# Patient Record
Sex: Male | Born: 1995 | Race: Black or African American | Hispanic: No | Marital: Single | Smoking: Never smoker
Health system: Southern US, Community
[De-identification: ages and names within clinical notes are randomized; demographics above are authoritative.]

---
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
## 2016-08-15 ENCOUNTER — Emergency Department (HOSPITAL_BASED_OUTPATIENT_CLINIC_OR_DEPARTMENT_OTHER)
Admission: EM | Admit: 2016-08-15 | Discharge: 2016-08-15 | Disposition: A | Discharge status: Home / Self Care | Payer: Self-pay | Attending: Emergency Medicine | Admitting: Emergency Medicine

## 2016-08-15 ENCOUNTER — Encounter (HOSPITAL_BASED_OUTPATIENT_CLINIC_OR_DEPARTMENT_OTHER): Payer: Self-pay | Admitting: *Deleted

## 2016-08-15 ENCOUNTER — Emergency Department (HOSPITAL_BASED_OUTPATIENT_CLINIC_OR_DEPARTMENT_OTHER): Payer: Self-pay

## 2016-08-15 DIAGNOSIS — R05 Cough: Secondary | ICD-10-CM | POA: Insufficient documentation

## 2016-08-15 DIAGNOSIS — M791 Myalgia: Secondary | ICD-10-CM | POA: Insufficient documentation

## 2016-08-15 DIAGNOSIS — J111 Influenza due to unidentified influenza virus with other respiratory manifestations: Secondary | ICD-10-CM

## 2016-08-15 DIAGNOSIS — R630 Anorexia: Secondary | ICD-10-CM | POA: Insufficient documentation

## 2016-08-15 DIAGNOSIS — R51 Headache: Secondary | ICD-10-CM | POA: Insufficient documentation

## 2016-08-15 DIAGNOSIS — R69 Illness, unspecified: Secondary | ICD-10-CM

## 2016-08-15 MED ORDER — ACETAMINOPHEN 500 MG PO TABS
1000.0000 mg | ORAL_TABLET | Freq: Once | ORAL | Status: AC
  Administered 2016-08-15: 1000 mg via ORAL
  Filled 2016-08-15: qty 2

## 2016-08-15 MED ORDER — IBUPROFEN 800 MG PO TABS
800.0000 mg | ORAL_TABLET | Freq: Once | ORAL | Status: AC
  Administered 2016-08-15: 800 mg via ORAL
  Filled 2016-08-15: qty 1

## 2016-08-15 NOTE — Discharge Instructions (Signed)
Return to the ED with any concerns including difficulty breathing, vomiting and not able to keep down liquids, decreased urine output, decreased level of alertness/lethargy, or any other alarming symptoms  °

## 2016-08-15 NOTE — ED Provider Notes (Signed)
[facility] Note   CSN: [id number] Arrival date & time: 08/15/16  [id number]     History   Chief Complaint Chief Complaint  Patient presents with  . Generalized Body Aches    HPI Bryan Shelton is a 20 y.o. male.  HPI  Pt presenting with c/o fever over the past 3 days.  He states fever is associated with generalized body aches.  He has tried theraflu which provided temporary relief.  Began to have cough yesterday.  No vomiting or diarrhea.  Denies sore throat.  Denies sick contacts.   Immunizations are up to date.  No recent travel.  Has not received flu shot this season.  States he has not been drinking liquids well because he just doesn't have a good appetite.  There are no other associated systemic symptoms, there are no other alleviating or modifying factors.   History reviewed. No pertinent past medical history.  There are no active problems to display for this patient.   History reviewed. No pertinent surgical history.     Home Medications    Prior to Admission medications   Not on File    Family History No family history on file.  Social History Social History  Substance Use Topics  . Smoking status: Never Smoker  . Smokeless tobacco: Never Used  . Alcohol use No     Allergies   Review of patient's allergies indicates no known allergies.   Review of Systems Review of Systems  ROS reviewed and all otherwise negative except for mentioned in HPI   Physical Exam Updated Vital Signs BP 167/78 (BP Location: Right Arm)   Pulse 99   Temp 99 F (37.2 C) (Oral)   Resp 20   Ht 6\' 5"  (1.956 m)   Wt 280 lb (127 kg)   SpO2 98%   BMI 33.20 kg/m  Vitals reviewed Physical Exam Physical Examination: General appearance - alert, well appearing, and in no distress Mental status - alert, oriented to person, place, and time Eyes - no conjunctival injection, no scleral icterus Mouth - mucous membranes moist, pharynx normal without  lesions Neck - supple, no significant adenopathy Chest - clear to auscultation, no wheezes, rales or rhonchi, symmetric air entry Heart - normal rate, regular rhythm, normal S1, S2, no murmurs, rubs, clicks or gallops Abdomen - soft, nontender, nondistended, no masses or organomegaly Neurological - alert, oriented, normal speech Extremities - peripheral pulses normal, no pedal edema, no clubbing or cyanosis Skin - normal coloration and turgor, no rashes  ED Treatments / Results  Labs (all labs ordered are listed, but only abnormal results are displayed) Labs Reviewed - No data to display  EKG  EKG Interpretation None       Radiology Dg Chest 2 View  Result Date: 08/15/2016 CLINICAL DATA:  Cough, fever. EXAM: CHEST  2 VIEW COMPARISON:  Radiographs of February 21, 2014. FINDINGS: The heart size and mediastinal contours are within normal limits. Both lungs are clear. No pneumothorax or pleural effusion is noted. The visualized skeletal structures are unremarkable. IMPRESSION: No active cardiopulmonary disease. Electronically Signed   By: Lupita RaiderJames  Green Jr, M.D.   On: 08/15/2016 09:16    Procedures Procedures (including critical care time)  Medications Ordered in ED Medications  acetaminophen (TYLENOL) tablet 1,000 mg (1,000 mg Oral Given 08/15/16 0841)  ibuprofen (ADVIL,MOTRIN) tablet 800 mg (800 mg Oral Given 08/15/16 0958)     Initial Impression / Assessment and Plan / ED Course  I have  reviewed the triage vital signs and the nursing notes.  Pertinent labs & imaging results that were available during my care of the patient were reviewed by me and considered in my medical decision making (see chart for details).  Clinical Course    Pt presenting with c/o febrile illness with body aches and cough.  Symptoms most c/w viral illness/ILI.  CXR reassuring.  Pt treated with tylenol and ibuprofen and encouraged po hydration.   Patient is overall nontoxic and well hydrated in appearance.    Discharged with strict return precautions.  Pt agreeable with plan.   Final Clinical Impressions(s) / ED Diagnoses   Final diagnoses:  Influenza-like illness    New Prescriptions There are no discharge medications for this patient.    Jerelyn Scott, MD 08/15/16 [phone]

## 2016-08-15 NOTE — ED Notes (Signed)
Apple juice given per pt request

## 2016-08-15 NOTE — ED Notes (Signed)
Pt drank all of apple juice and requested more. Given.

## 2016-08-15 NOTE — ED Triage Notes (Signed)
C/o chills in evening h/a's and has been taking theraflu. C/o body aches. No n/v/d. Onset 4 days ago.

## 2016-08-16 ENCOUNTER — Encounter (HOSPITAL_BASED_OUTPATIENT_CLINIC_OR_DEPARTMENT_OTHER): Payer: Self-pay | Admitting: Emergency Medicine

## 2016-08-16 ENCOUNTER — Emergency Department (HOSPITAL_BASED_OUTPATIENT_CLINIC_OR_DEPARTMENT_OTHER): Payer: Medicaid Other

## 2016-08-16 ENCOUNTER — Emergency Department (HOSPITAL_BASED_OUTPATIENT_CLINIC_OR_DEPARTMENT_OTHER)
Admission: EM | Admit: 2016-08-16 | Discharge: 2016-08-16 | Disposition: A | Discharge status: Home / Self Care | Payer: Medicaid Other | Attending: Emergency Medicine | Admitting: Emergency Medicine

## 2016-08-16 DIAGNOSIS — M791 Myalgia: Secondary | ICD-10-CM | POA: Insufficient documentation

## 2016-08-16 DIAGNOSIS — B54 Unspecified malaria: Secondary | ICD-10-CM | POA: Insufficient documentation

## 2016-08-16 DIAGNOSIS — M542 Cervicalgia: Secondary | ICD-10-CM | POA: Insufficient documentation

## 2016-08-16 DIAGNOSIS — H6692 Otitis media, unspecified, left ear: Secondary | ICD-10-CM | POA: Insufficient documentation

## 2016-08-16 LAB — COMPREHENSIVE METABOLIC PANEL
ALBUMIN: 4 g/dL (ref 3.5–5.0)
ALT: 53 U/L (ref 17–63)
AST: 27 U/L (ref 15–41)
Alkaline Phosphatase: 87 U/L (ref 38–126)
Anion gap: 10 (ref 5–15)
BUN: 14 mg/dL (ref 6–20)
CO2: 25 mmol/L (ref 22–32)
CREATININE: 1.25 mg/dL — AB (ref 0.61–1.24)
Calcium: 9 mg/dL (ref 8.9–10.3)
Chloride: 99 mmol/L — ABNORMAL LOW (ref 101–111)
GFR calc Af Amer: >60 mL/min (ref >60)
GFR calc non Af Amer: >60 mL/min (ref >60)
GLUCOSE: 120 mg/dL — AB (ref 65–99)
Potassium: 3.7 mmol/L (ref 3.5–5.1)
SODIUM: 134 mmol/L — AB (ref 135–145)
TOTAL PROTEIN: 7.6 g/dL (ref 6.5–8.1)
Total Bilirubin: 3.8 mg/dL — ABNORMAL HIGH (ref 0.3–1.2)

## 2016-08-16 LAB — CBC WITH DIFFERENTIAL/PLATELET
BASOS ABS: 0.1 10*3/uL (ref 0.0–0.1)
Band Neutrophils: 19 %
Basophils Relative: 1 %
EOS PCT: 0 %
Eosinophils Absolute: 0 10*3/uL (ref 0.0–0.7)
HCT: 43.3 % (ref 39.0–52.0)
Hemoglobin: 15 g/dL (ref 13.0–17.0)
LYMPHS ABS: 0.7 10*3/uL (ref 0.7–4.0)
Lymphocytes Relative: 10 %
MCH: 21.3 pg — ABNORMAL LOW (ref 26.0–34.0)
MCHC: 34.6 g/dL (ref 30.0–36.0)
MCV: 61.4 fL — ABNORMAL LOW (ref 78.0–100.0)
MONO ABS: 0.7 10*3/uL (ref 0.1–1.0)
Monocytes Relative: 10 %
NEUTROS PCT: 60 %
Neutro Abs: 5.1 10*3/uL (ref 1.7–7.7)
PLATELETS: 67 10*3/uL — AB (ref 150–400)
RBC: 7.05 MIL/uL — AB (ref 4.22–5.81)
RDW: 17 % — ABNORMAL HIGH (ref 11.5–15.5)
WBC: 6.6 10*3/uL (ref 4.0–10.5)

## 2016-08-16 LAB — LIPASE, BLOOD: Lipase: 19 U/L (ref 11–51)

## 2016-08-16 MED ORDER — CEFDINIR 300 MG PO CAPS: 300.0000 mg | ORAL_CAPSULE | Freq: Two times a day (BID) | ORAL | 0 refills | Status: AC

## 2016-08-16 MED ORDER — ARTEMETHER-LUMEFANTRINE 20-120 MG PO TABS: 4.0000 | ORAL_TABLET | Freq: Two times a day (BID) | ORAL | 0 refills | Status: AC

## 2016-08-16 MED ORDER — SODIUM CHLORIDE 0.9 % IV BOLUS (SEPSIS)
1000.0000 mL | Freq: Once | INTRAVENOUS | Status: AC
  Administered 2016-08-16: 1000 mL via INTRAVENOUS

## 2016-08-16 MED ORDER — IBUPROFEN 800 MG PO TABS
800.0000 mg | ORAL_TABLET | Freq: Once | ORAL | Status: AC
  Administered 2016-08-16: 800 mg via ORAL
  Filled 2016-08-16: qty 1

## 2016-08-16 MED ORDER — ACETAMINOPHEN 325 MG PO TABS
650.0000 mg | ORAL_TABLET | Freq: Once | ORAL | Status: AC | PRN
  Administered 2016-08-16: 650 mg via ORAL
  Filled 2016-08-16: qty 2

## 2016-08-16 MED ORDER — IOPAMIDOL (ISOVUE-300) INJECTION 61%
100.0000 mL | Freq: Once | INTRAVENOUS | Status: AC | PRN
  Administered 2016-08-16: 100 mL via INTRAVENOUS

## 2016-08-16 NOTE — ED Triage Notes (Signed)
Fever and HA x 4 days.
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
## 2016-08-16 NOTE — ED Notes (Signed)
One set blood cultures obtained and sent to lab

## 2016-08-16 NOTE — ED Notes (Signed)
Pt alert, NAD, calm, interactive, resps e/u, speaking in clear complete sentences, no dyspnea noted, temp elevated, VSS, IVF infusing, given urinal.

## 2016-08-16 NOTE — Discharge Instructions (Signed)
If your malaria is positive you will get a call from Infectious disease for an appointment.

## 2016-08-17 ENCOUNTER — Encounter (HOSPITAL_BASED_OUTPATIENT_CLINIC_OR_DEPARTMENT_OTHER): Payer: Self-pay | Admitting: Emergency Medicine

## 2016-08-17 ENCOUNTER — Other Ambulatory Visit: Payer: Self-pay | Admitting: Infectious Diseases

## 2016-08-17 DIAGNOSIS — B54 Unspecified malaria: Secondary | ICD-10-CM

## 2016-08-17 LAB — PATHOLOGIST SMEAR REVIEW

## 2016-08-17 LAB — PARASITE EXAM SCREEN, BLOOD-W CONF TO LABCORP (NOT @ ARMC)

## 2016-08-21 ENCOUNTER — Telehealth (HOSPITAL_BASED_OUTPATIENT_CLINIC_OR_DEPARTMENT_OTHER): Payer: Self-pay | Admitting: Emergency Medicine

## 2016-08-21 LAB — PARASITE EXAM, BLOOD: PARASITE EXAM, BLOOD: POSITIVE — AB

## 2016-11-24 ENCOUNTER — Emergency Department (HOSPITAL_BASED_OUTPATIENT_CLINIC_OR_DEPARTMENT_OTHER): Payer: Self-pay

## 2016-11-24 ENCOUNTER — Encounter (HOSPITAL_BASED_OUTPATIENT_CLINIC_OR_DEPARTMENT_OTHER): Payer: Self-pay | Admitting: Emergency Medicine

## 2016-11-24 ENCOUNTER — Emergency Department (HOSPITAL_BASED_OUTPATIENT_CLINIC_OR_DEPARTMENT_OTHER)
Admission: EM | Admit: 2016-11-24 | Discharge: 2016-11-24 | Disposition: A | Discharge status: Home / Self Care | Payer: Self-pay | Attending: Emergency Medicine | Admitting: Emergency Medicine

## 2016-11-24 DIAGNOSIS — W1839XA Other fall on same level, initial encounter: Secondary | ICD-10-CM | POA: Insufficient documentation

## 2016-11-24 DIAGNOSIS — Y929 Unspecified place or not applicable: Secondary | ICD-10-CM | POA: Insufficient documentation

## 2016-11-24 DIAGNOSIS — S62635A Displaced fracture of distal phalanx of left ring finger, initial encounter for closed fracture: Secondary | ICD-10-CM | POA: Insufficient documentation

## 2016-11-24 DIAGNOSIS — S62305A Unspecified fracture of fourth metacarpal bone, left hand, initial encounter for closed fracture: Secondary | ICD-10-CM

## 2016-11-24 DIAGNOSIS — Y9367 Activity, basketball: Secondary | ICD-10-CM | POA: Insufficient documentation

## 2016-11-24 DIAGNOSIS — Y999 Unspecified external cause status: Secondary | ICD-10-CM | POA: Insufficient documentation

## 2016-11-24 NOTE — ED Triage Notes (Signed)
Patient states that he is having pain to his left hand after injuring it at basketball about 4 hours ago

## 2016-11-24 NOTE — Discharge Instructions (Signed)
Wear ulnar gutter splint as applied.  Ice for 20 minutes every 2 hours while awake for the next 2 days.  Ibuprofen 600 mg every 6 hours as needed for pain.  You are to follow-up with Dr. Amanda PeaGramig in the orthopedic clinic in the next 2-3 days. His contact information has been provided in this discharge summary for you to call and make these arrangements.

## 2016-11-24 NOTE — ED Provider Notes (Signed)
[facility]   CSN: [id number] Arrival date & time: 11/24/16  0106     History   Chief Complaint Chief Complaint  Patient presents with  . Hand Injury    left    HPI Emmerich Cryer Shitgurum is a 21 y.o. male.  Patient is a 21 year old male with no significant past medical history. He presents for evaluation of left hand pain. He reports playing basketball this evening and falling down injuring his fourth metacarpal.   The history is provided by the patient.  Hand Injury   The incident occurred 3 to 5 hours ago. The incident occurred at the gym. The injury mechanism was a fall. The pain is present in the left hand. The quality of the pain is described as throbbing. The pain is moderate. The pain has been constant since the incident. The symptoms are aggravated by movement and palpation.    History reviewed. No pertinent past medical history.  There are no active problems to display for this patient.   History reviewed. No pertinent surgical history.     Home Medications    Prior to Admission medications   Medication Sig Start Date End Date Taking? Authorizing Provider  artemether-lumefantrine (COARTEM) 20-120 MG TABS tablet Take 4 tablets by mouth 2 (two) times daily. Take 4 tabs when you get the script, then 4 tabs 8 hours later, then 4 tabs twice a day for the next 2 days. 08/16/16   Melene Plan, DO  Polyethyl Glycol-Propyl Glycol (SYSTANE) 0.4-0.3 % SOLN Apply 1 drop to eye 4 (four) times daily as needed. 12/12/11   Domenick Gong, MD    Family History History reviewed. No pertinent family history.  Social History Social History  Substance Use Topics  . Smoking status: Never Smoker  . Smokeless tobacco: Never Used  . Alcohol use No     Allergies   Patient has no known allergies.   Review of Systems Review of Systems  All other systems reviewed and are negative.    Physical Exam Updated Vital Signs BP (!) 175/116 (BP Location:  Right Arm)   Pulse 79   Temp 99.5 F (37.5 C) (Oral)   Resp 18   Ht 6\' 5"  (1.956 m)   Wt 280 lb (127 kg)   SpO2 100%   BMI 33.20 kg/m   Physical Exam  Constitutional: He is oriented to person, place, and time. He appears well-developed and well-nourished. No distress.  HENT:  Head: Normocephalic and atraumatic.  Neck: Normal range of motion. Neck supple.  Pulmonary/Chest: Effort normal.  Musculoskeletal:  The left hand appears grossly normal. There is tenderness over the distal fourth metacarpal. He is able to flex and extend all of his fingers and sensation and capillary refill is intact to all fingertips.  Neurological: He is alert and oriented to person, place, and time.  Skin: Skin is warm and dry. He is not diaphoretic.  Nursing note and vitals reviewed.    ED Treatments / Results  Labs (all labs ordered are listed, but only abnormal results are displayed) Labs Reviewed - No data to display  EKG  EKG Interpretation None       Radiology No results found.  Procedures Procedures (including critical care time)  Medications Ordered in ED Medications - No data to display   Initial Impression / Assessment and Plan / ED Course  I have reviewed the triage vital signs and the nursing notes.  Pertinent labs & imaging results that were available  during my care of the patient were reviewed by me and considered in my medical decision making (see chart for details).  Clinical Course     X-rays reveal a fracture through the distal fourth metacarpal. He will be placed in an ulnar gutter splint and is to follow-up with hand surgery the beginning of next week.  Final Clinical Impressions(s) / ED Diagnoses   Final diagnoses:  None    New Prescriptions New Prescriptions   No medications on file     Geoffery Lyonsouglas Nehemie Casserly, MD 11/24/16 0205

## 2017-08-13 ENCOUNTER — Encounter (HOSPITAL_BASED_OUTPATIENT_CLINIC_OR_DEPARTMENT_OTHER): Payer: Self-pay

## 2017-08-13 ENCOUNTER — Emergency Department (HOSPITAL_BASED_OUTPATIENT_CLINIC_OR_DEPARTMENT_OTHER)
Admission: EM | Admit: 2017-08-13 | Discharge: 2017-08-14 | Disposition: A | Discharge status: Home / Self Care | Payer: Self-pay | Attending: Emergency Medicine | Admitting: Emergency Medicine

## 2017-08-13 DIAGNOSIS — H6982 Other specified disorders of Eustachian tube, left ear: Secondary | ICD-10-CM | POA: Insufficient documentation

## 2017-08-13 NOTE — ED Triage Notes (Signed)
Pt c/o left ear for two days, no meds prior to arrival

## 2017-08-14 MED ORDER — PREDNISONE 50 MG PO TABS: 50.0000 mg | ORAL_TABLET | Freq: Every day | ORAL | 0 refills | Status: AC

## 2017-08-14 MED ORDER — TRAMADOL HCL 50 MG PO TABS: 50.0000 mg | ORAL_TABLET | Freq: Four times a day (QID) | ORAL | 0 refills | Status: AC | PRN

## 2017-08-14 MED ORDER — GUAIFENESIN ER 1200 MG PO TB12: 1.0000 | ORAL_TABLET | Freq: Two times a day (BID) | ORAL | 0 refills | Status: AC

## 2017-08-14 NOTE — ED Provider Notes (Signed)
[facility]   CSN: [id number] Arrival date & time: 08/13/17  2302     History   Chief Complaint Chief Complaint  Patient presents with  . Ear Pain    HPI Bryan Shelton is a 21 y.o. male.  HPI Patient presents to the emergency department with left ear pain that started 2 days ago.  The patient states he did not take any medications prior to arrival for his symptoms.  He states that the pain has been persistent over that timeframe.  Patient states he has not had any fever, nausea, vomiting, weakness, dizziness, headache, blurred vision, neck pain, cough, nasal congestion, sore throat, or syncope History reviewed. No pertinent past medical history.  There are no active problems to display for this patient.   History reviewed. No pertinent surgical history.     Home Medications    Prior to Admission medications   Medication Sig Start Date End Date Taking? Authorizing Provider  artemether-lumefantrine (COARTEM) 20-120 MG TABS tablet Take 4 tablets by mouth 2 (two) times daily. Take 4 tabs when you get the script, then 4 tabs 8 hours later, then 4 tabs twice a day for the next 2 days. 08/16/16   Melene Plan, DO  Polyethyl Glycol-Propyl Glycol (SYSTANE) 0.4-0.3 % SOLN Apply 1 drop to eye 4 (four) times daily as needed. 12/12/11   Domenick Gong, MD    Family History No family history on file.  Social History Social History  Substance Use Topics  . Smoking status: Never Smoker  . Smokeless tobacco: Never Used  . Alcohol use No     Allergies   Patient has no known allergies.   Review of Systems Review of Systems All other systems negative except as documented in the HPI. All pertinent positives and negatives as reviewed in the HPI.  Physical Exam Updated Vital Signs BP (!) 181/128 (BP Location: Right Arm)   Pulse 65   Temp 99.1 F (37.3 C) (Oral)   Resp 18   Ht  (1.93 m)   Wt 131.5 kg (290 lb)   SpO2 99%   BMI 35.30  kg/m   Physical Exam  Constitutional: He is oriented to person, place, and time. He appears well-developed and well-nourished. No distress.  HENT:  Head: Normocephalic and atraumatic.  Right Ear: Hearing, tympanic membrane, external ear and ear canal normal.  Left Ear: Hearing, external ear and ear canal normal. No drainage or swelling. No mastoid tenderness. Tympanic membrane is not injected, not scarred, not perforated, not erythematous and not bulging. A middle ear effusion is present. No decreased hearing is noted.  Eyes: Pupils are equal, round, and reactive to light.  Pulmonary/Chest: Effort normal.  Neurological: He is alert and oriented to person, place, and time.  Skin: Skin is warm and dry.  Psychiatric: He has a normal mood and affect.  Nursing note and vitals reviewed.    ED Treatments / Results  Labs (all labs ordered are listed, but only abnormal results are displayed) Labs Reviewed - No data to display  EKG  EKG Interpretation None       Radiology No results found.  Procedures Procedures (including critical care time)  Medications Ordered in ED Medications - No data to display   Initial Impression / Assessment and Plan / ED Course  I have reviewed the triage vital signs and the nursing notes.  Pertinent labs & imaging results that were available during my care of the patient were reviewed by  me and considered in my medical decision making (see chart for details).    Patient be treated for eustachian tube dysfunction.  Told to return here as needed.  Patient agrees the plan and all questions were answered  Final Clinical Impressions(s) / ED Diagnoses   Final diagnoses:  None    New Prescriptions New Prescriptions   No medications on file     Charlestine Night, Cordelia Poche 08/14/17 0027    Paula Libra, MD 08/14/17 [phone]

## 2017-08-14 NOTE — ED Notes (Signed)
Pt verbalizes understanding of d/c instructions and denies any further need at this time. 

## 2017-08-14 NOTE — Discharge Instructions (Signed)
Return here as needed.  Follow up with her primary care doctor °

## 2017-09-19 IMAGING — CR DG HAND COMPLETE 3+V*L*
3 series · 3 of 3 positions shown · non-contrast
Comparison: None.

CLINICAL DATA: Basketball injury 4 hours ago with pain and swelling
to the left fourth finger.

EXAM:
LEFT HAND - COMPLETE 3+ VIEW

[x hand pa left]
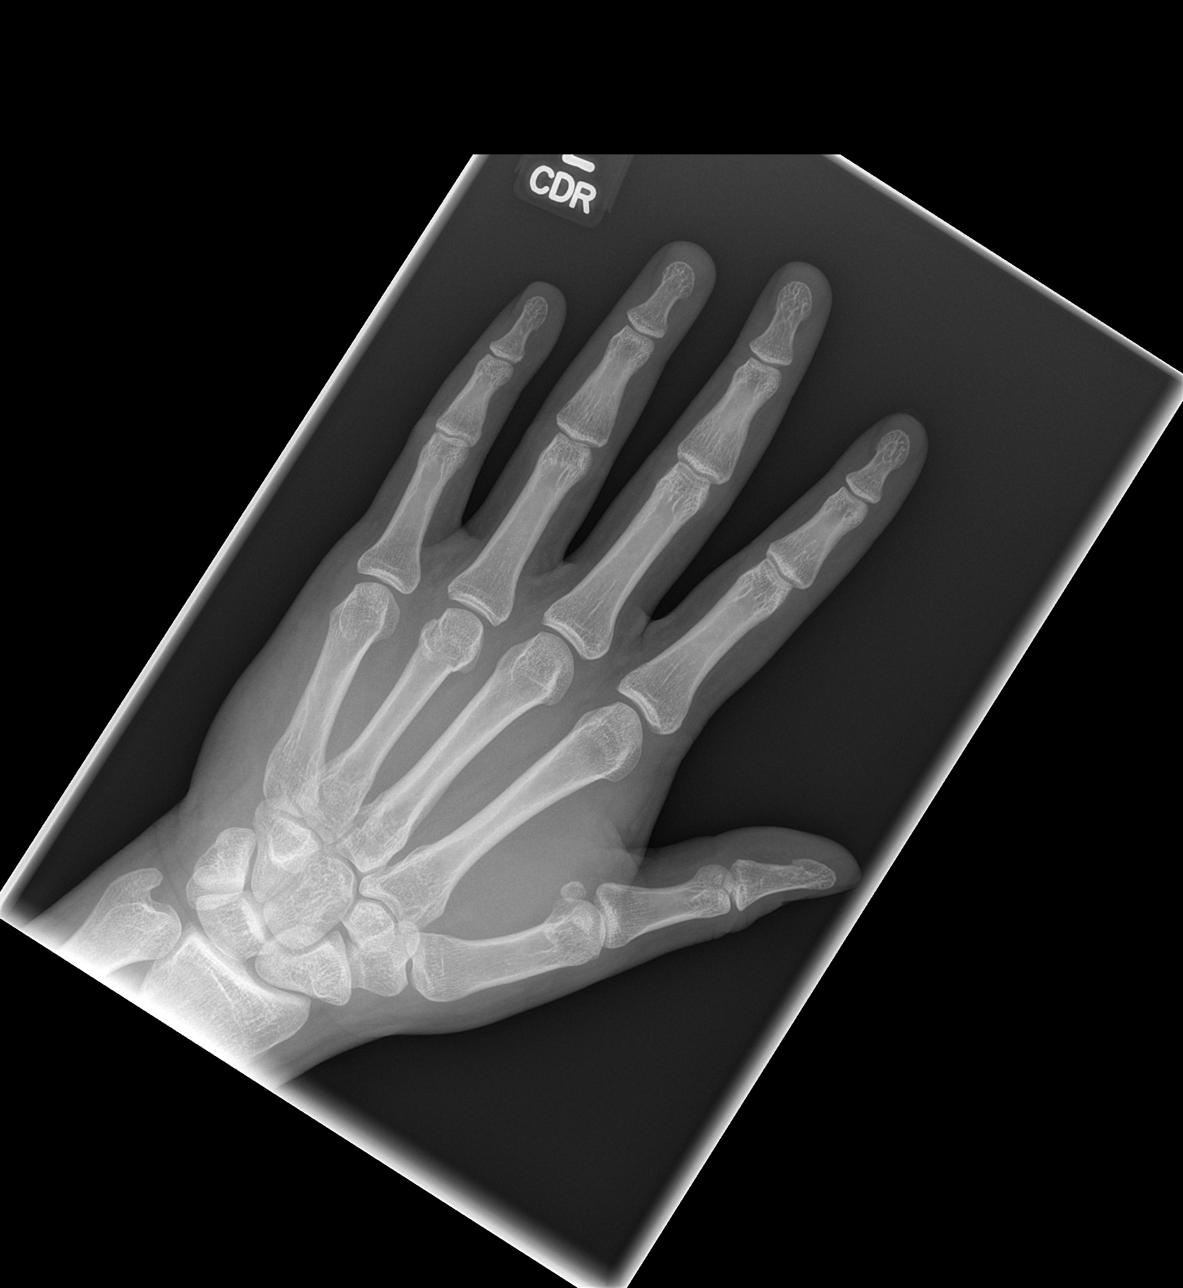

[x hand oblique left]
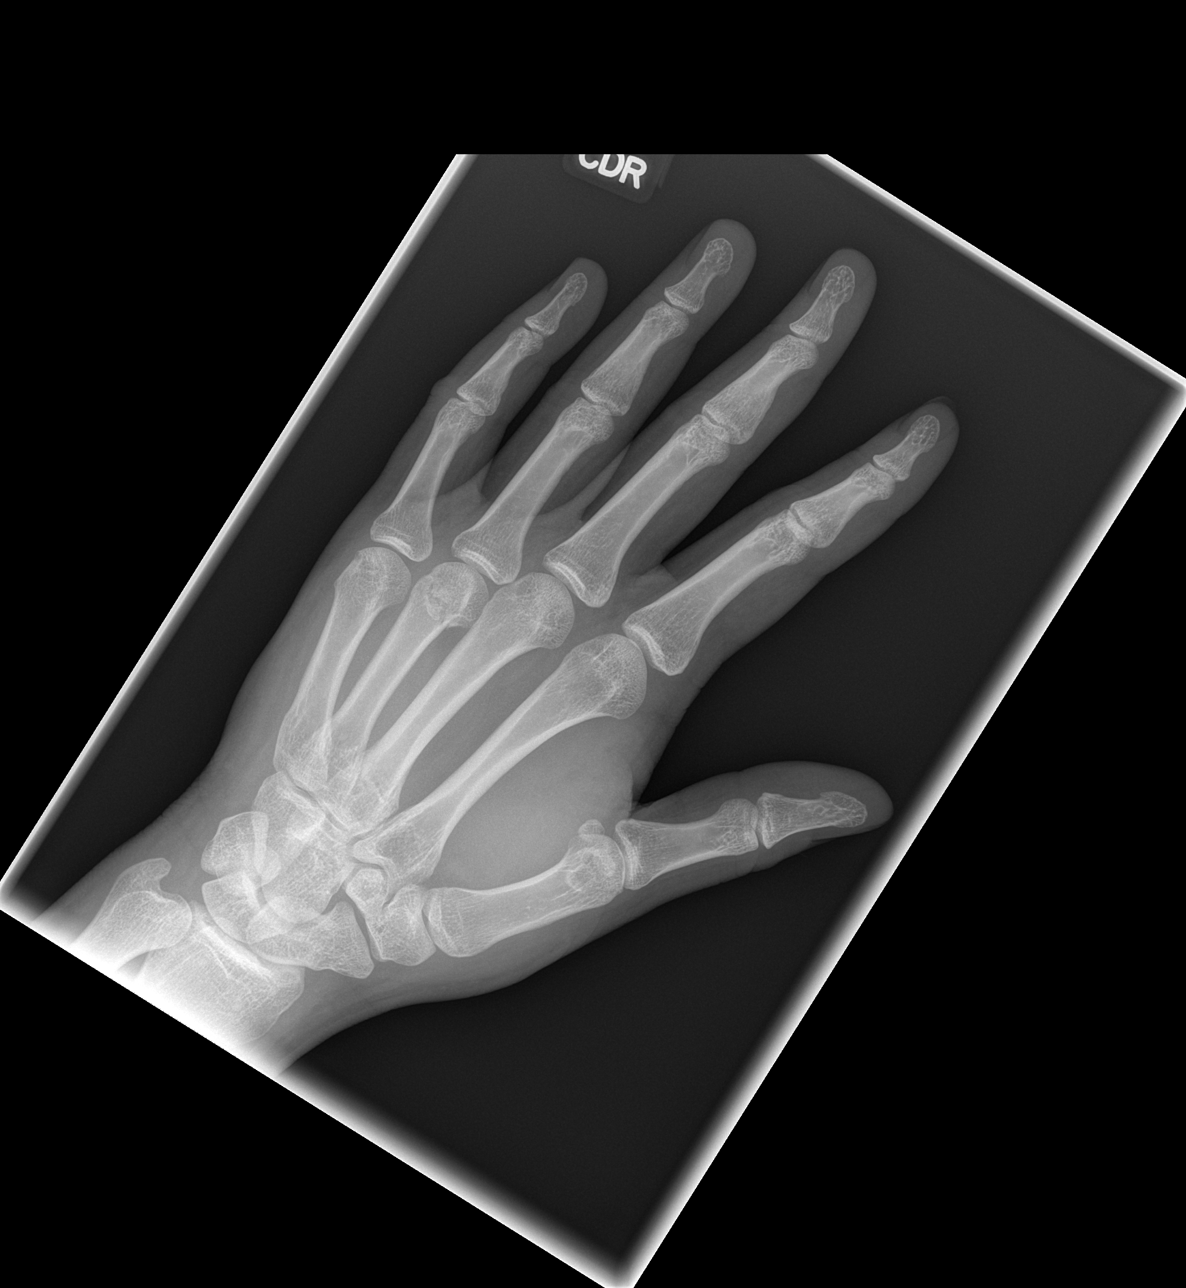

[x hand lat left]
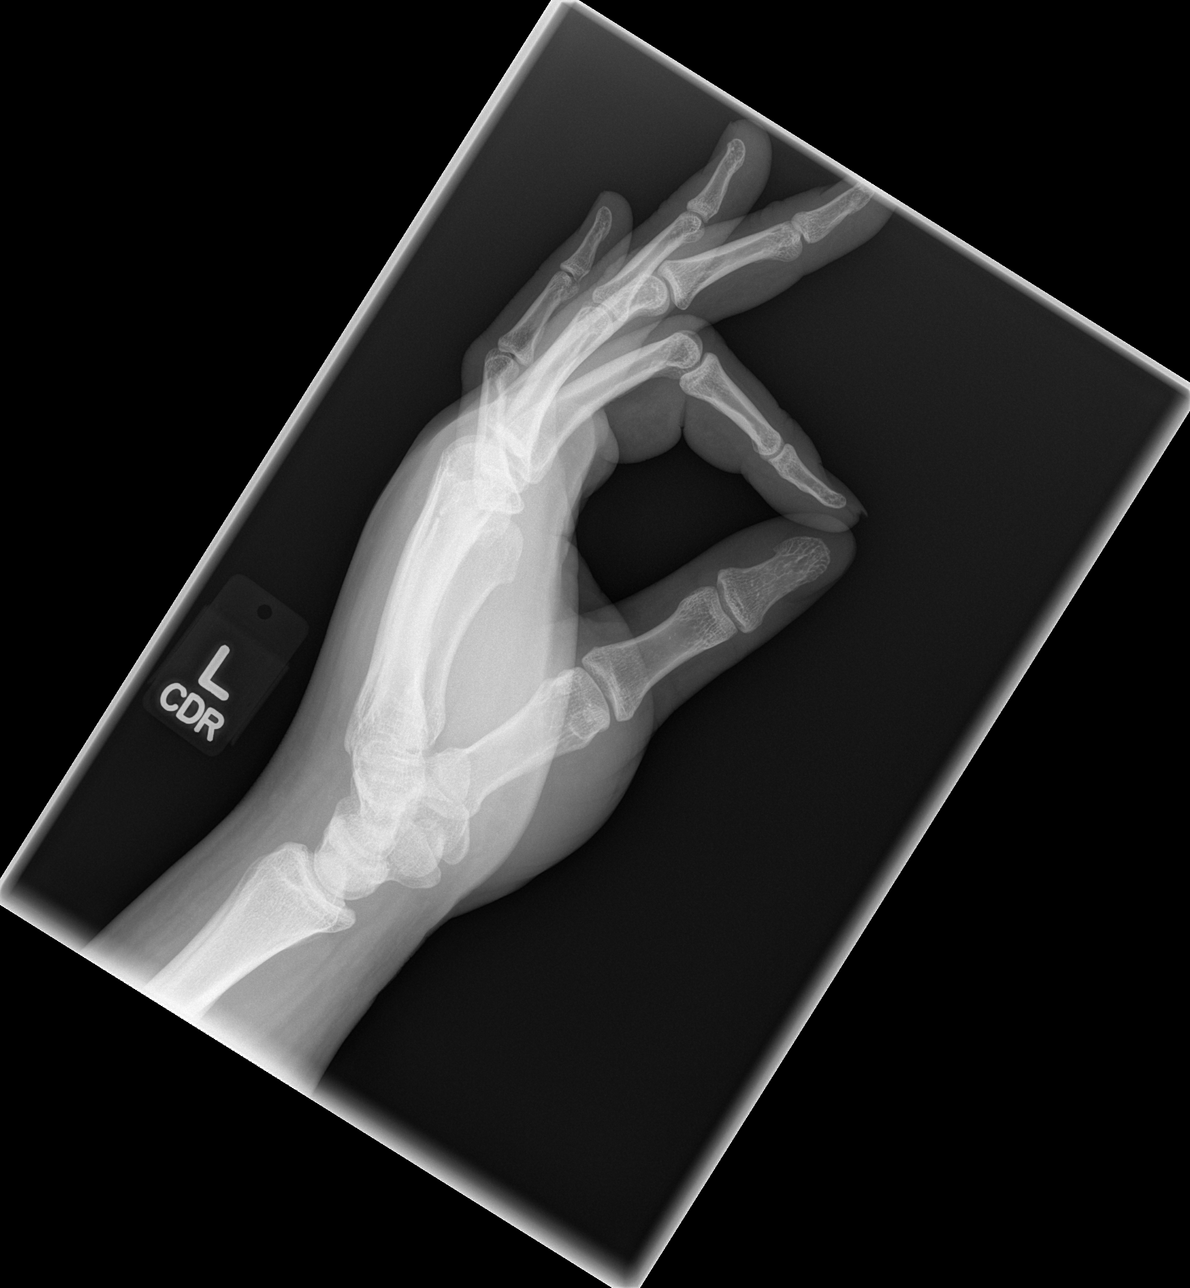

[3 of 3 positions shown; findings below may reference images not displayed]

FINDINGS: Transverse fracture of the distal left fourth metacarpal bone with
mild volar angulation of the distal fracture fragment. Overlying
dorsal soft tissue swelling in the left hand. No other fractures
identified. No focal bone lesion or bone destruction.
IMPRESSION: Transverse fracture of the distal left fourth metacarpal bone with
associated soft tissue swelling.

## 2017-12-21 ENCOUNTER — Emergency Department (HOSPITAL_BASED_OUTPATIENT_CLINIC_OR_DEPARTMENT_OTHER)
Admission: EM | Admit: 2017-12-21 | Discharge: 2017-12-21 | Disposition: A | Discharge status: Home / Self Care | Payer: Self-pay | Attending: Emergency Medicine | Admitting: Emergency Medicine

## 2017-12-21 ENCOUNTER — Encounter (HOSPITAL_BASED_OUTPATIENT_CLINIC_OR_DEPARTMENT_OTHER): Payer: Self-pay | Admitting: *Deleted

## 2017-12-21 ENCOUNTER — Other Ambulatory Visit: Payer: Self-pay

## 2017-12-21 DIAGNOSIS — H66002 Acute suppurative otitis media without spontaneous rupture of ear drum, left ear: Secondary | ICD-10-CM | POA: Insufficient documentation

## 2017-12-21 MED ORDER — AMOXICILLIN 500 MG PO CAPS: 500.0000 mg | ORAL_CAPSULE | Freq: Three times a day (TID) | ORAL | 0 refills | Status: AC

## 2017-12-21 NOTE — ED Notes (Signed)
Left ear pain x 3 days.   

## 2017-12-21 NOTE — ED Triage Notes (Signed)
C/o left ear pain x 3 days   Took tylenol x 2   And put peroxide in ear without relief

## 2017-12-21 NOTE — ED Provider Notes (Signed)
[facility] EMERGENCY DEPARTMENT Provider Note   CSN: [id number] Arrival date & time: 12/21/17  0112     History   Chief Complaint Chief Complaint  Patient presents with  . Otalgia    HPI Bryan Shelton is a 22 y.o. male.  Patient is a 22 year old male presenting with complaints of left ear pain.  This is been ongoing for the past 3 days.  He denies any injury or trauma.  He denies any drainage, fever.  He does state that his hearing is somewhat muffled.   The history is provided by the patient.  Otalgia  This is a new problem. Episode onset: 3 days ago. There is pain in the left ear. The problem occurs constantly. The problem has been gradually worsening. There has been no fever. The pain is moderate. Pertinent negatives include no ear discharge.    History reviewed. No pertinent past medical history.  There are no active problems to display for this patient.   History reviewed. No pertinent surgical history.     Home Medications    Prior to Admission medications   Medication Sig Start Date End Date Taking? Authorizing Provider  artemether-lumefantrine (COARTEM) 20-120 MG TABS tablet Take 4 tablets by mouth 2 (two) times daily. Take 4 tabs when you get the script, then 4 tabs 8 hours later, then 4 tabs twice a day for the next 2 days. 08/16/16   Melene Plan, DO  Guaifenesin 1200 MG TB12 Take 1 tablet (1,200 mg total) by mouth 2 (two) times daily. 08/14/17   Lawyer, Cristal Deer, PA-C  Polyethyl Glycol-Propyl Glycol (SYSTANE) 0.4-0.3 % SOLN Apply 1 drop to eye 4 (four) times daily as needed. 12/12/11   Domenick Gong, MD  predniSONE (DELTASONE) 50 MG tablet Take 1 tablet (50 mg total) by mouth daily. 08/14/17   Lawyer, Cristal Deer, PA-C  traMADol (ULTRAM) 50 MG tablet Take 1 tablet (50 mg total) by mouth every 6 (six) hours as needed for severe pain. 08/14/17   Lawyer, Cristal Deer, PA-C    Family History No family history on file.  Social History Social  History   Tobacco Use  . Smoking status: Never Smoker  . Smokeless tobacco: Never Used  Substance Use Topics  . Alcohol use: No  . Drug use: No     Allergies   Patient has no known allergies.   Review of Systems Review of Systems  HENT: Positive for ear pain. Negative for ear discharge.   All other systems reviewed and are negative.    Physical Exam Updated Vital Signs BP (!) 183/119 (BP Location: Left Arm)   Pulse 81   Temp 98.4 F (36.9 C) (Oral)   Resp 18   Ht 6\' 4"  (1.93 m)   Wt 122.5 kg (270 lb)   SpO2 98%   BMI 32.87 kg/m   Physical Exam  Constitutional: He is oriented to person, place, and time. He appears well-developed and well-nourished. No distress.  HENT:  Head: Normocephalic and atraumatic.  The right TM is clear.  The left TM is erythematous with fluid noted in the middle ear.  Neck: Normal range of motion. Neck supple.  Neurological: He is alert and oriented to person, place, and time.  Skin: Skin is warm and dry. He is not diaphoretic.  Nursing note and vitals reviewed.    ED Treatments / Results  Labs (all labs ordered are listed, but only abnormal results are displayed) Labs Reviewed - No data to display  EKG  EKG  Interpretation None       Radiology No results found.  Procedures Procedures (including critical care time)  Medications Ordered in ED Medications - No data to display   Initial Impression / Assessment and Plan / ED Course  I have reviewed the triage vital signs and the nursing notes.  Pertinent labs & imaging results that were available during my care of the patient were reviewed by me and considered in my medical decision making (see chart for details).  This appears to be an otitis media.  He will be treated with amoxicillin and follow-up as needed.  Final Clinical Impressions(s) / ED Diagnoses   Final diagnoses:  None    ED Discharge Orders    None       Geoffery Lyonselo, Trigo Winterbottom, MD 12/21/17 [phone]

## 2017-12-21 NOTE — Discharge Instructions (Signed)
Amoxicillin as prescribed.  Motrin 600 mg every 6 hours as needed for pain.

## 2017-12-21 NOTE — ED Notes (Signed)
Pt discharged to home with  family. NAD,.
